# Patient Record
Sex: Male | Born: 1985 | Race: White | Hispanic: No | Marital: Single | Smoking: Current every day smoker
Health system: Southern US, Community
[De-identification: ages and names within clinical notes are randomized; demographics above are authoritative.]

---

## 2008-12-25 ENCOUNTER — Emergency Department: Payer: Self-pay | Admitting: Internal Medicine

## 2010-02-11 IMAGING — CT CT HEAD WITHOUT CONTRAST
1 series · 16 of 30 positions shown, 20 images · non-contrast
Comparison: none

REASON FOR EXAM: injury    minor care 7
COMMENTS:   LMP: (Male)

[Series 2: soft tissue · axial · 0.41mm/px · z∈[-146,-2]mm · 16 of 33 slices shown, 20 images]
[im 2/33  brain]
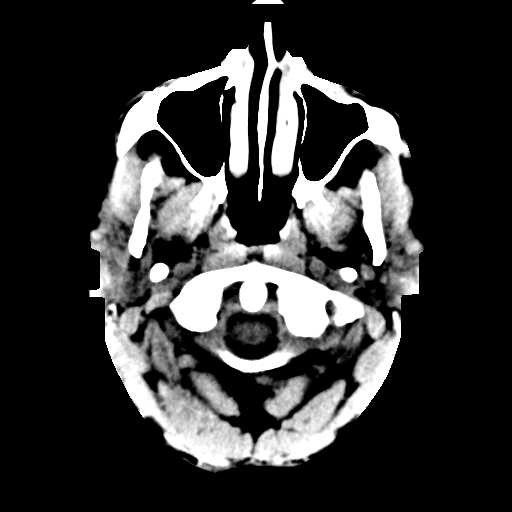
[im 2/33  bone]
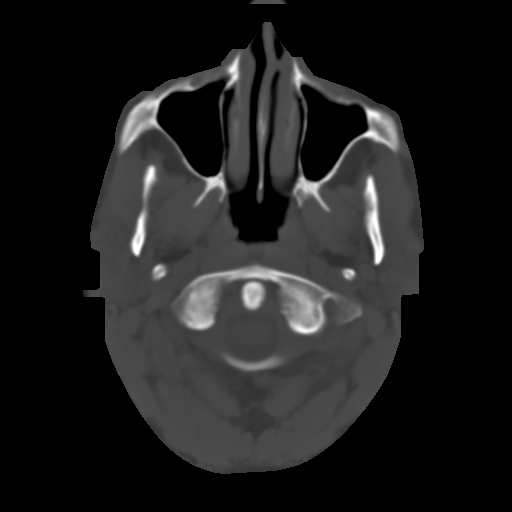
[im 4/33  brain]
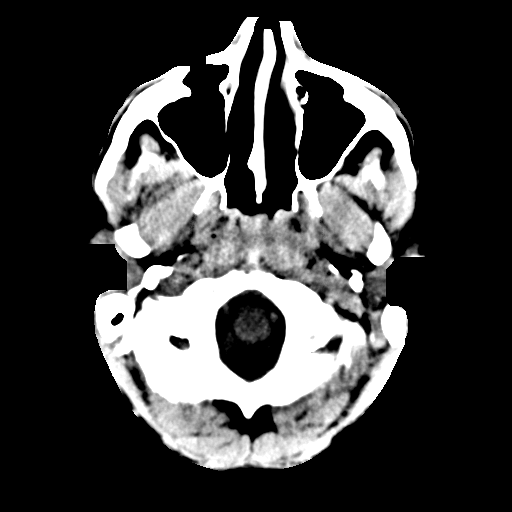
[im 6/33  brain]
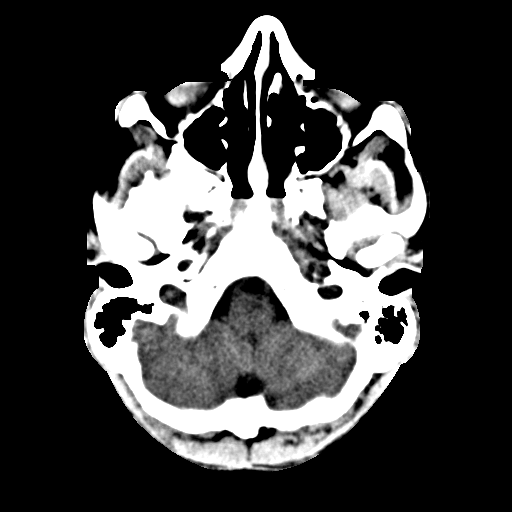
[im 8/33  brain]
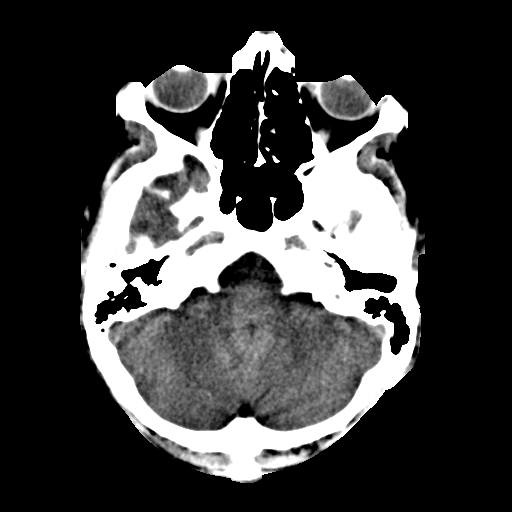
[im 9/33  brain]
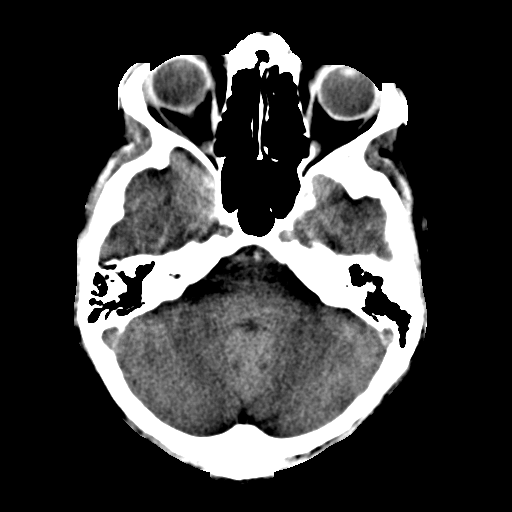
[im 9/33  bone]
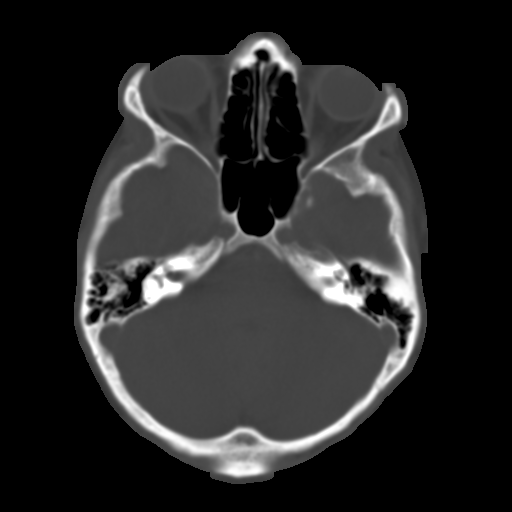
[im 12/33  brain]
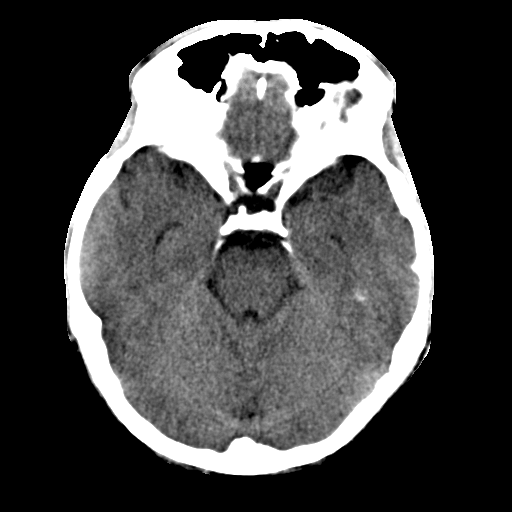
[im 14/33  brain]
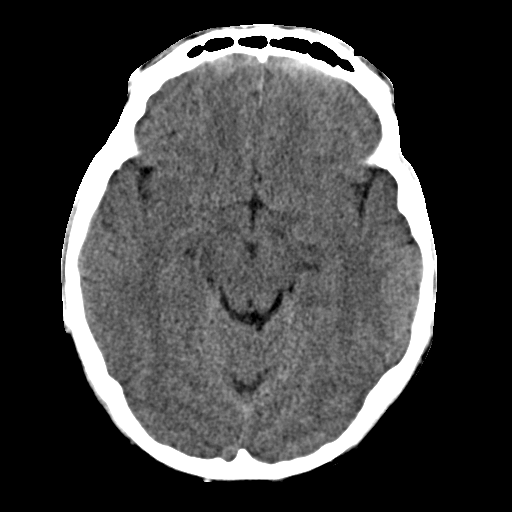
[im 16/33  brain]
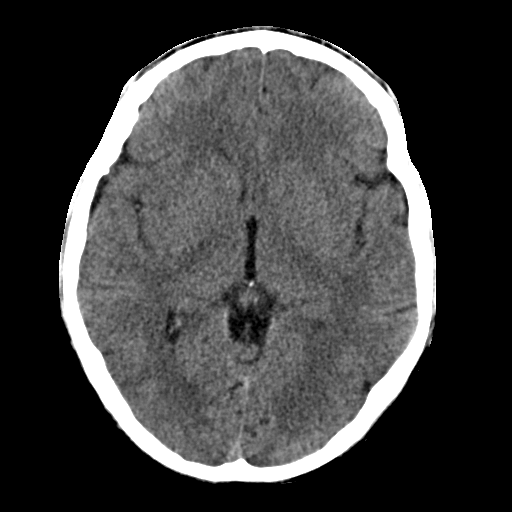
[im 17/33  brain]
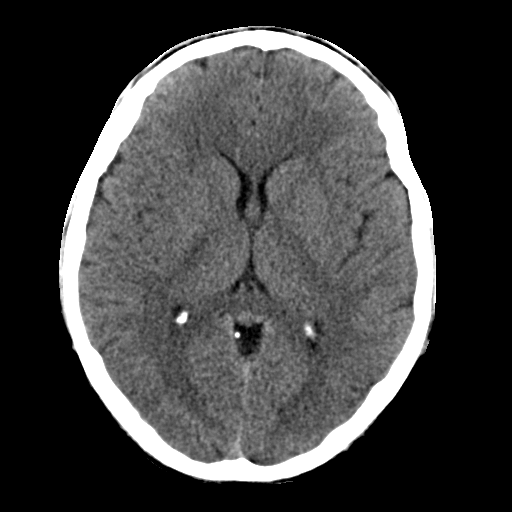
[im 17/33  bone]
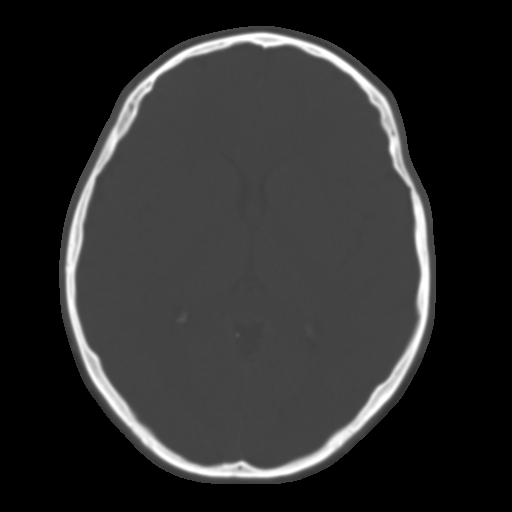
[im 19/33  brain]
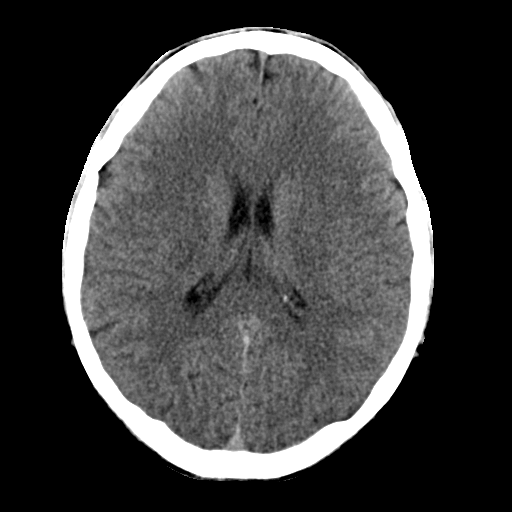
[im 21/33  brain]
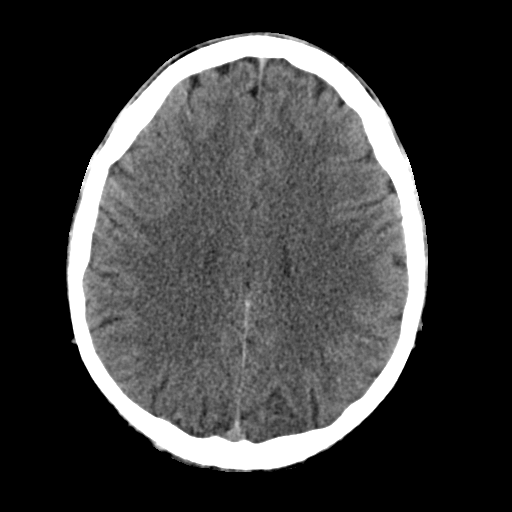
[im 24/33  brain]
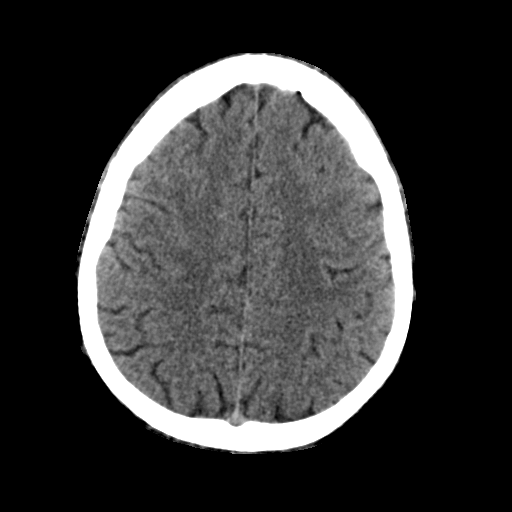
[im 25/33  brain]
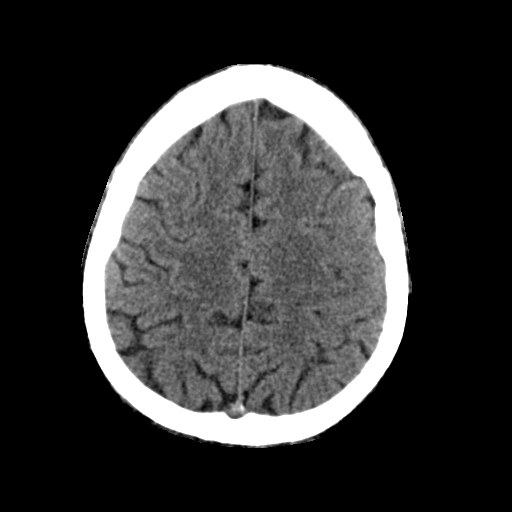
[im 25/33  bone]
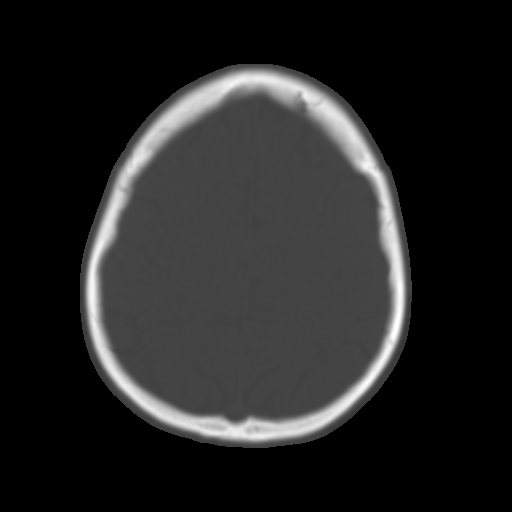
[im 27/33  brain]
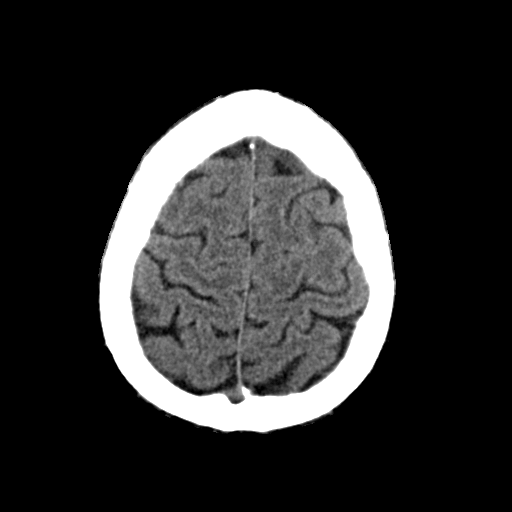
[im 29/33  brain]
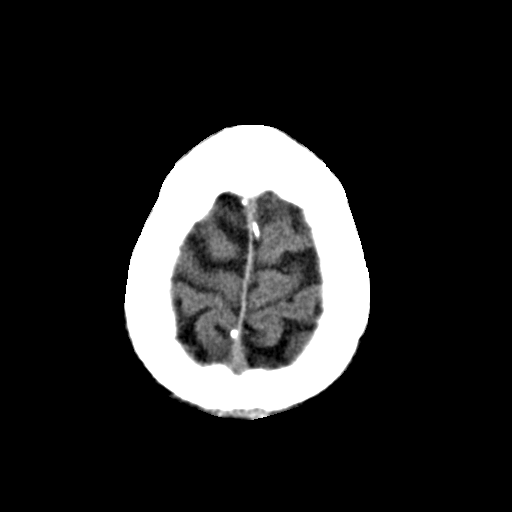
[im 31/33  brain]
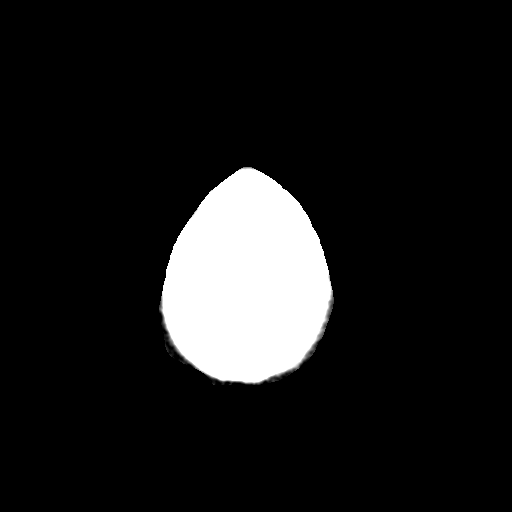

[16 of 30 positions shown; findings below may reference images not displayed]

PROCEDURE:     CT  - CT HEAD WITHOUT CONTRAST  - December 25, 2008  [DATE]

RESULT:     There is no evidence of intra-axial nor extra-axial fluid
collections nor evidence of acute hemorrhage. No secondary signs are
appreciated to suggest mass effect, subacute or chronic infarction. The
visualized bony skeleton demonstrates no evidence of a depressed skull
fracture.
IMPRESSION: 1. No evidence of acute intracranial abnormalities.

2. If there is persistent clinical concern, repeat evaluation in 24 to 48
hours is recommended.

Dosenovic Sorak, Physician's Assistant of the Emergency Department was
informed of these findings at the time of the initial interpretation.

## 2015-06-23 ENCOUNTER — Other Ambulatory Visit: Payer: Self-pay | Admitting: Internal Medicine

## 2015-06-23 DIAGNOSIS — M25512 Pain in left shoulder: Secondary | ICD-10-CM

## 2015-06-29 ENCOUNTER — Ambulatory Visit
Admission: RE | Admit: 2015-06-29 | Discharge: 2015-06-29 | Disposition: A | Discharge status: Home / Self Care | Payer: Worker's Compensation | Source: Ambulatory Visit | Attending: Internal Medicine | Admitting: Internal Medicine

## 2015-06-29 DIAGNOSIS — M25512 Pain in left shoulder: Secondary | ICD-10-CM

## 2016-03-01 ENCOUNTER — Encounter (HOSPITAL_COMMUNITY): Payer: Self-pay

## 2016-03-01 ENCOUNTER — Emergency Department (HOSPITAL_COMMUNITY)
Admission: EM | Admit: 2016-03-01 | Discharge: 2016-03-01 | Disposition: A | Discharge status: Home / Self Care | Payer: Self-pay | Attending: Emergency Medicine | Admitting: Emergency Medicine

## 2016-03-01 DIAGNOSIS — F17200 Nicotine dependence, unspecified, uncomplicated: Secondary | ICD-10-CM | POA: Insufficient documentation

## 2016-03-01 DIAGNOSIS — M549 Dorsalgia, unspecified: Secondary | ICD-10-CM | POA: Insufficient documentation

## 2016-03-01 DIAGNOSIS — R109 Unspecified abdominal pain: Secondary | ICD-10-CM | POA: Insufficient documentation

## 2016-03-01 DIAGNOSIS — R10A0 Flank pain, unspecified side: Secondary | ICD-10-CM

## 2016-03-01 DIAGNOSIS — R300 Dysuria: Secondary | ICD-10-CM | POA: Insufficient documentation

## 2016-03-01 MED ORDER — HYDROCODONE-ACETAMINOPHEN 5-325 MG PO TABS
2.0000 | ORAL_TABLET | Freq: Once | ORAL | Status: AC
  Administered 2016-03-01: 2 via ORAL
  Filled 2016-03-01: qty 2

## 2016-03-01 NOTE — ED Notes (Signed)
EDP at bedside  

## 2016-03-01 NOTE — ED Notes (Signed)
Went into patient's room to draw labs and start an IV. Pt refusing all labs at this time. Pt states he is "terrified of needles." Dr. Hyacinth MeekerMiller notified.

## 2016-03-01 NOTE — ED Provider Notes (Signed)
CSN: [id number]     Arrival date & time 03/01/16  1205 History  By signing my name below, I, Ian Hess, attest that this documentation has been prepared under the direction and in the presence of Ian Hong, MD. Electronically Signed: Ronney Hess, ED Scribe. 03/01/2016. 12:37 PM.    Chief Complaint  Patient presents with  . Flank Pain   The history is provided by the patient. No language interpreter was used.    HPI Comments: Ian Hess is a 30 y.o. male who presents to the Emergency Department complaining of recurrent, persistent, sharp lower back pain and discomfort urinating, without urethral discharge or drainage, that began yesterday. He notes one prior history of kidney stones, passed spontaneously several years ago. He reports no medications taken PTA. Pain is not positional, per patient. He denies fever, chills, nausea, vomiting, or numbness or weakness of BLE.   Pt refused all testing other than a urine sample and GC swab  History reviewed. No pertinent past medical history. History reviewed. No pertinent past surgical history. No family history on file. Social History  Substance Use Topics  . Smoking status: Current Every Day Smoker  . Smokeless tobacco: None  . Alcohol Use: No    Review of Systems  Constitutional: Negative for fever and chills.  Gastrointestinal: Negative for nausea and vomiting.  Genitourinary: Positive for dysuria and flank pain. Negative for discharge.  Musculoskeletal: Positive for back pain.  Neurological: Negative for weakness and numbness.  All other systems reviewed and are negative.   Allergies  Review of patient's allergies indicates no known allergies.  Home Medications   Prior to Admission medications   Not on File   BP 115/81 mmHg  Pulse 91  Temp(Src) 99.3 F (37.4 C) (Temporal)  Resp 18  Ht  (1.803 m)  Wt 185 lb (83.915 kg)  BMI 25.81 kg/m2  SpO2 97% Physical Exam  Constitutional: He appears well-developed and  well-nourished. No distress.  HENT:  Head: Normocephalic and atraumatic.  Mouth/Throat: Oropharynx is clear and moist. No oropharyngeal exudate.  Eyes: Conjunctivae and EOM are normal. Pupils are equal, round, and reactive to light. Right eye exhibits no discharge. Left eye exhibits no discharge. No scleral icterus.  Neck: Normal range of motion. Neck supple. No JVD present. No thyromegaly present.  Cardiovascular: Normal rate, regular rhythm, normal heart sounds and intact distal pulses.  Exam reveals no gallop and no friction rub.   No murmur heard. Pulmonary/Chest: Effort normal and breath sounds normal. No respiratory distress. He has no wheezes. He has no rales.  Abdominal: Soft. Bowel sounds are normal. He exhibits no distension and no mass. There is no tenderness.  No CVA tenderness. Abdomen non-tender.  Genitourinary: Penis normal. Circumcised. No discharge found.  No urethral drainage, discharge, or blood. Normal circumcised penis.   Musculoskeletal: Normal range of motion. He exhibits no edema or tenderness.  Lymphadenopathy:    He has no cervical adenopathy.  Neurological: He is alert. Coordination normal.  Skin: Skin is warm and dry. No rash noted. No erythema.  Psychiatric: He has a normal mood and affect. His behavior is normal.  Nursing note and vitals reviewed.   ED Course  Procedures (including critical care time)  DIAGNOSTIC STUDIES: Oxygen Saturation is 97% on RA, normal by my interpretation.    COORDINATION OF CARE: 12:18 PM - Discussed treatment plan with pt at bedside which includes STD testing. Pt's significant other is pushing him to get STD testing. Pt verbalized understanding and  agreed to plan.    Emergency Focused Ultrasound Exam Limited retroperitoneal ultrasound of kidneys  Performed and interpreted by Dr. Hyacinth MeekerMiller at 12:20 PM Indication: flank pain Focused abdominal ultrasound with both kidneys imaged in transverse and longitudinal planes in  real-time. Interpretation: No hydronephrosis visualized.  Images archived electronically  CPT Code: [phone] [id number] (limited retroperitoneal)    Labs Review Labs Reviewed  URINALYSIS, ROUTINE W REFLEX MICROSCOPIC (NOT AT [facility])  GC/CHLAMYDIA PROBE AMP ([place]) NOT AT [facility]      Imaging Review No results found. I have personally reviewed and evaluated these images and lab results as part of my medical decision-making.    MDM   Final diagnoses:  Flank pain   I personally performed the services described in this documentation, which was scribed in my presence. The recorded information has been reviewed and is accurate.    Pt refused to give Urine sample and then stated he had to leave to pick child up  Pt informed He does not have a dx and can return at any time to pursue w/u - he is in agreement.  Meds given in ED:  Medications  HYDROcodone-acetaminophen (NORCO/VICODIN) 5-325 MG per tablet 2 tablet (2 tablets Oral Given 03/01/16 1416)    New Prescriptions   No medications on file    Ian HongBrian Kallan Bischoff, MD 03/01/16 1418

## 2016-03-01 NOTE — ED Notes (Signed)
Pt found to be leaving with significant other prior to d/c. Pt stated he had somewhere he needed to be. MD aware.

## 2016-03-01 NOTE — Discharge Instructions (Signed)
You have not given us a sample of urine to test for kidney stones or infection - you are free to go but I cannot definitively diagnose you with any specific illness  You may return at any time for further evaluation  See list below for family doctor f/u.  [facility]    Kari BaarsEdward Hawkins MD. Specialty: Pulmonary Disease Contact information: [address]  [address]  [place] [address]  [phone]   Syliva OvermanMargaret Simpson, MD. Specialty: [facility] Contact information: [address]  [place] [address]  [phone]   Lilyan PuntScott Luking, MD. Specialty: [facility] Contact information: [address]  [address]  [place] [address]  [phone]   Avon Gullyesfaye Fanta, MD Specialty: Internal Medicine Contact information: [address]  [place] [address]  [phone]   Catalina PizzaZach Hall, MD. Specialty: Internal Medicine Contact information: [address]  [place] [address]  [phone]   Butch PennyAngus Mcinnis, MD. Specialty: Family Medicine Contact information: [address]  [place] [address]  [phone]   John GiovanniStephen Knowlton, MD. Specialty: [facility] Contact information: [address]  [address]  [place] [address]  [phone]   Carylon Perchesoy Fagan, MD. Specialty: Internal Medicine Contact information: [address]  [address]  [place] [address]  [phone]

## 2016-03-01 NOTE — ED Notes (Signed)
Pt c/o pain in r flank since yesterday.  Reports difficulty urinating.  Has history of kidney stones.

## 2016-03-01 NOTE — ED Notes (Signed)
Pt at desk stating he has to leave to pick up his child. EDP aware and states pt can be discharge. Pt left before getting discharge papers
